# Patient Record
Sex: Female | Born: 2000 | Race: White | Hispanic: No | Marital: Single
Health system: Southern US, Community
[De-identification: ages and names within clinical notes are randomized; demographics above are authoritative.]

## PROBLEM LIST (undated history)

## (undated) DIAGNOSIS — Q8719 Other congenital malformation syndromes predominantly associated with short stature: Secondary | ICD-10-CM

## (undated) DIAGNOSIS — I350 Nonrheumatic aortic (valve) stenosis: Secondary | ICD-10-CM

## (undated) DIAGNOSIS — I370 Nonrheumatic pulmonary valve stenosis: Secondary | ICD-10-CM

## (undated) HISTORY — PX: CARDIAC SURGERY: SHX584

---
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
## 2019-02-09 IMAGING — DX DG CHEST 2V
2 series · 2 of 2 positions shown · non-contrast
Comparison: March 20, 2017

CLINICAL DATA: Cough for 10 days

EXAM:
CHEST - 2 VIEW

[chest pa]
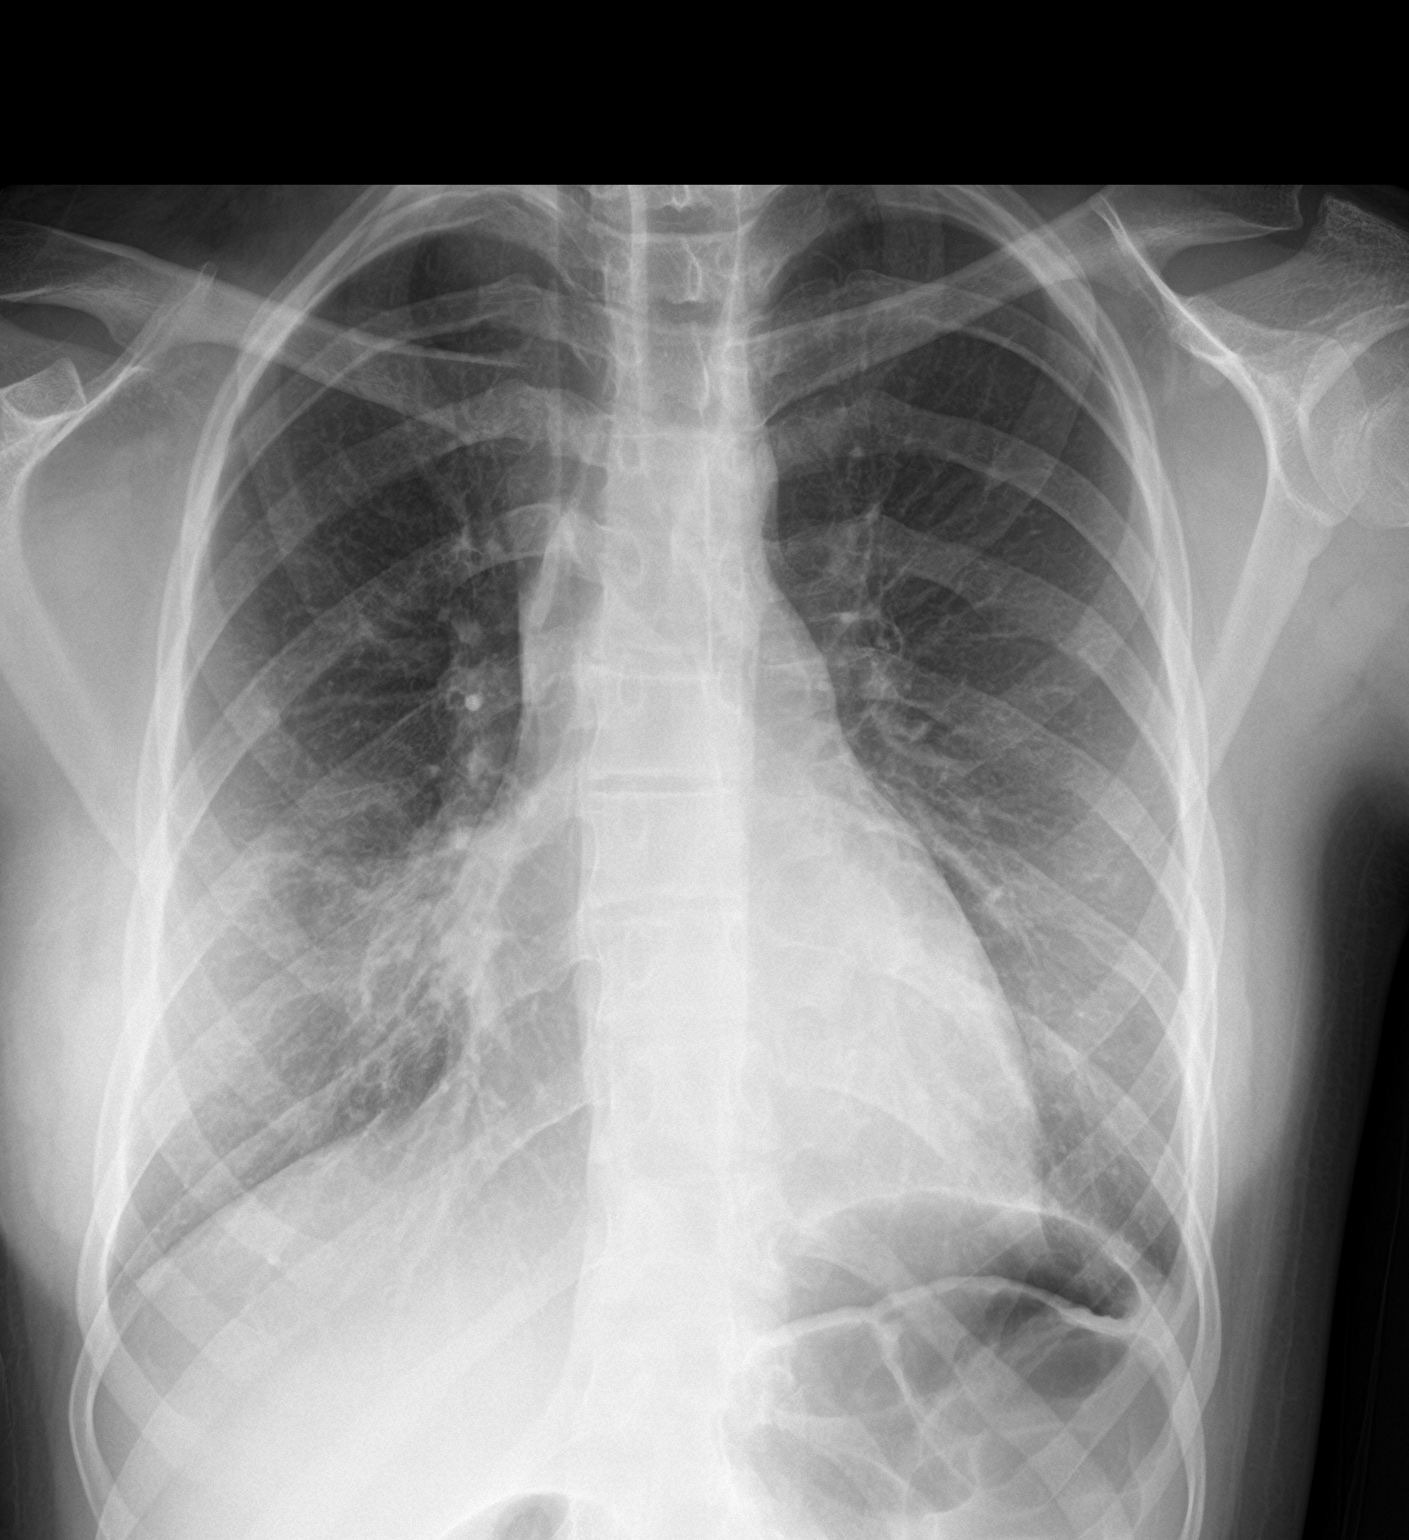

[chest lat]
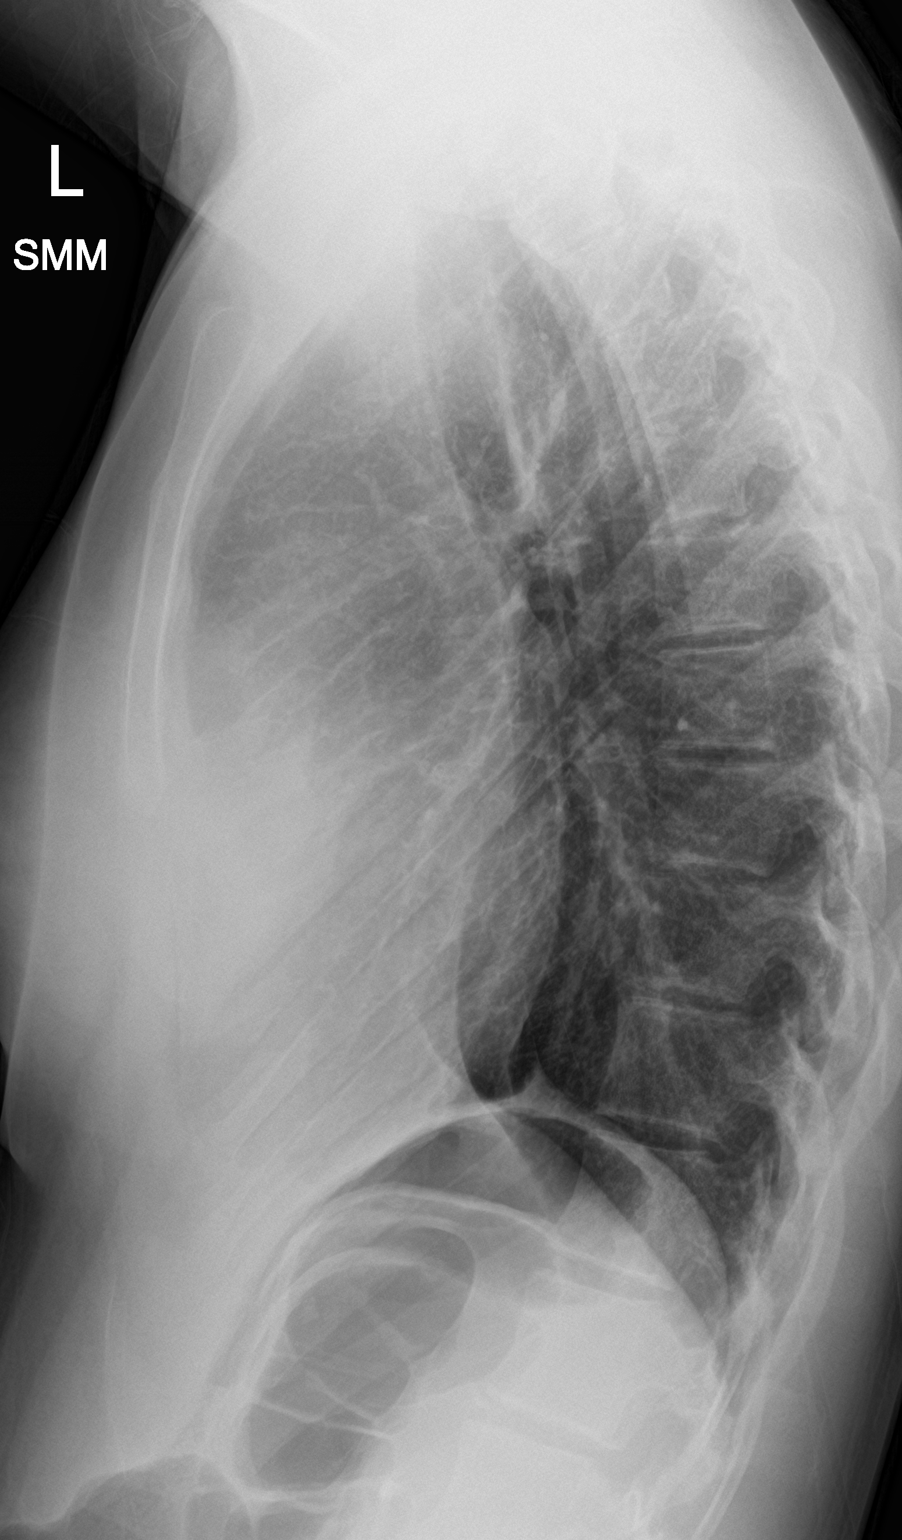

[2 of 2 positions shown; findings below may reference images not displayed]

FINDINGS: There is airspace consolidation in the right middle lobe. Lungs
elsewhere are clear. The heart size and pulmonary vascularity are
normal. No adenopathy. No bone lesions.
IMPRESSION: Right middle lobe airspace consolidation consistent with pneumonia.
Lungs elsewhere clear. No adenopathy evident.
